# Patient Record
Sex: Female | Born: 1975 | Race: White | Hispanic: No | Smoking: Current every day smoker
Health system: Southern US, Community
[De-identification: ages and names within clinical notes are randomized; demographics above are authoritative.]

## PROBLEM LIST (undated history)

## (undated) DIAGNOSIS — F909 Attention-deficit hyperactivity disorder, unspecified type: Secondary | ICD-10-CM

## (undated) HISTORY — PX: BREAST SURGERY: SHX581

## (undated) HISTORY — DX: Attention-deficit hyperactivity disorder, unspecified type: F90.9

---

## 2009-09-22 ENCOUNTER — Encounter: Admission: RE | Admit: 2009-09-22 | Discharge: 2009-09-22 | Payer: Self-pay | Admitting: Specialist

## 2011-08-24 ENCOUNTER — Emergency Department (HOSPITAL_COMMUNITY)
Admission: EM | Admit: 2011-08-24 | Discharge: 2011-08-24 | Disposition: A | Discharge status: Home / Self Care | Payer: BC Managed Care – PPO | Attending: Emergency Medicine | Admitting: Emergency Medicine

## 2011-08-24 ENCOUNTER — Encounter (HOSPITAL_COMMUNITY): Payer: Self-pay

## 2011-08-24 ENCOUNTER — Emergency Department (HOSPITAL_COMMUNITY): Payer: BC Managed Care – PPO

## 2011-08-24 DIAGNOSIS — R6883 Chills (without fever): Secondary | ICD-10-CM | POA: Insufficient documentation

## 2011-08-24 DIAGNOSIS — N83201 Unspecified ovarian cyst, right side: Secondary | ICD-10-CM

## 2011-08-24 DIAGNOSIS — R61 Generalized hyperhidrosis: Secondary | ICD-10-CM | POA: Insufficient documentation

## 2011-08-24 DIAGNOSIS — N83209 Unspecified ovarian cyst, unspecified side: Secondary | ICD-10-CM | POA: Insufficient documentation

## 2011-08-24 DIAGNOSIS — R110 Nausea: Secondary | ICD-10-CM | POA: Insufficient documentation

## 2011-08-24 DIAGNOSIS — R109 Unspecified abdominal pain: Secondary | ICD-10-CM | POA: Insufficient documentation

## 2011-08-24 LAB — COMPREHENSIVE METABOLIC PANEL
ALT: 23 U/L (ref 0–35)
Calcium: 8.7 mg/dL (ref 8.4–10.5)
Creatinine, Ser: 0.56 mg/dL (ref 0.50–1.10)
GFR calc Af Amer: >90 mL/min (ref >90)
Glucose, Bld: 74 mg/dL (ref 70–99)
Sodium: 141 mEq/L (ref 135–145)
Total Protein: 6.9 g/dL (ref 6.0–8.3)

## 2011-08-24 LAB — DIFFERENTIAL
Basophils Absolute: 0.1 10*3/uL (ref 0.0–0.1)
Eosinophils Absolute: 0.1 10*3/uL (ref 0.0–0.7)
Eosinophils Relative: 1 % (ref 0–5)
Lymphs Abs: 1.8 10*3/uL (ref 0.7–4.0)

## 2011-08-24 LAB — CBC
MCH: 33 pg (ref 26.0–34.0)
MCV: 92.5 fL (ref 78.0–100.0)
Platelets: 220 10*3/uL (ref 150–400)
RDW: 12.7 % (ref 11.5–15.5)

## 2011-08-24 LAB — URINALYSIS, ROUTINE W REFLEX MICROSCOPIC
Hgb urine dipstick: NEGATIVE
Leukocytes, UA: NEGATIVE
Nitrite: NEGATIVE
Specific Gravity, Urine: 1.022 (ref 1.005–1.030)
Urobilinogen, UA: 0.2 mg/dL (ref 0.0–1.0)

## 2011-08-24 MED ORDER — HYDROCODONE-ACETAMINOPHEN 5-500 MG PO TABS: 1.0000 | ORAL_TABLET | Freq: Four times a day (QID) | ORAL | Status: AC | PRN

## 2011-08-24 MED ORDER — ONDANSETRON HCL 4 MG/2ML IJ SOLN
4.0000 mg | Freq: Once | INTRAMUSCULAR | Status: AC
  Administered 2011-08-24: 4 mg via INTRAVENOUS
  Filled 2011-08-24: qty 2

## 2011-08-24 MED ORDER — IOHEXOL 300 MG/ML  SOLN
20.0000 mL | INTRAMUSCULAR | Status: AC
  Administered 2011-08-24 (×2): 20 mL via ORAL

## 2011-08-24 MED ORDER — HYDROMORPHONE HCL PF 1 MG/ML IJ SOLN
1.0000 mg | Freq: Once | INTRAMUSCULAR | Status: AC
  Administered 2011-08-24: 1 mg via INTRAVENOUS
  Filled 2011-08-24: qty 1

## 2011-08-24 MED ORDER — HYDROMORPHONE HCL PF 1 MG/ML IJ SOLN
0.5000 mg | Freq: Once | INTRAMUSCULAR | Status: AC
  Administered 2011-08-24: 1 mg via INTRAVENOUS
  Filled 2011-08-24: qty 1

## 2011-08-24 MED ORDER — IOHEXOL 300 MG/ML  SOLN
100.0000 mL | Freq: Once | INTRAMUSCULAR | Status: AC | PRN
  Administered 2011-08-24: 100 mL via INTRAVENOUS

## 2011-08-24 MED ORDER — ONDANSETRON HCL 4 MG PO TABS: 4.0000 mg | ORAL_TABLET | Freq: Four times a day (QID) | ORAL | Status: AC

## 2011-08-24 MED ORDER — SODIUM CHLORIDE 0.9 % IV BOLUS (SEPSIS)
1000.0000 mL | Freq: Once | INTRAVENOUS | Status: AC
  Administered 2011-08-24: 1000 mL via INTRAVENOUS

## 2011-08-24 NOTE — ED Provider Notes (Signed)
Medical screening examination/treatment/procedure(s) were performed by non-physician practitioner and as supervising physician I was immediately available for consultation/collaboration.  Glenette Bookwalter, MD 08/24/11 1351 

## 2011-08-24 NOTE — Discharge Instructions (Signed)
Ovarian Cyst The ovaries are small organs that are on each side of the uterus. The ovaries are the organs that produce the female hormones, estrogen and progesterone. An ovarian cyst is a sac filled with fluid that can vary in its size. It is normal for a small cyst to form in women who are in the childbearing age and who have menstrual periods. This type of cyst is called a follicle cyst that becomes an ovulation cyst (corpus luteum cyst) after it produces the women's egg. It later goes away on its own if the woman does not become pregnant. There are other kinds of ovarian cysts that may cause problems and may need to be treated. The most serious problem is a cyst with cancer. It should be noted that menopausal women who have an ovarian cyst are at a higher risk of it being a cancer cyst. They should be evaluated very quickly, thoroughly and followed closely. This is especially true in menopausal women because of the high rate of ovarian cancer in women in menopause. CAUSES AND TYPES OF OVARIAN CYSTS:  FUNCTIONAL CYST: The follicle/corpus luteum cyst is a functional cyst that occurs every month during ovulation with the menstrual cycle. They go away with the next menstrual cycle if the woman does not get pregnant. Usually, there are no symptoms with a functional cyst.   ENDOMETRIOMA CYST: This cyst develops from the lining of the uterus tissue. This cyst gets in or on the ovary. It grows every month from the bleeding during the menstrual period. It is also called a "chocolate cyst" because it becomes filled with blood that turns brown. This cyst can cause pain in the lower abdomen during intercourse and with your menstrual period.   CYSTADENOMA CYST: This cyst develops from the cells on the outside of the ovary. They usually are not cancerous. They can get very big and cause lower abdomen pain and pain with intercourse. This type of cyst can twist on itself, cut off its blood supply and cause severe pain.  It also can easily rupture and cause a lot of pain.   DERMOID CYST: This type of cyst is sometimes found in both ovaries. They are found to have different kinds of body tissue in the cyst. The tissue includes skin, teeth, hair, and/or cartilage. They usually do not have symptoms unless they get very big. Dermoid cysts are rarely cancerous.   POLYCYSTIC OVARY: This is a rare condition with hormone problems that produces many small cysts on both ovaries. The cysts are follicle-like cysts that never produce an egg and become a corpus luteum. It can cause an increase in body weight, infertility, acne, increase in body and facial hair and lack of menstrual periods or rare menstrual periods. Many women with this problem develop type 2 diabetes. The exact cause of this problem is unknown. A polycystic ovary is rarely cancerous.   THECA LUTEIN CYST: Occurs when too much hormone (human chorionic gonadotropin) is produced and over-stimulates the ovaries to produce an egg. They are frequently seen when doctors stimulate the ovaries for invitro-fertilization (test tube babies).   LUTEOMA CYST: This cyst is seen during pregnancy. Rarely it can cause an obstruction to the birth canal during labor and delivery. They usually go away after delivery.  SYMPTOMS   Pelvic pain or pressure.   Pain during sexual intercourse.   Increasing girth (swelling) of the abdomen.   Abnormal menstrual periods.   Increasing pain with menstrual periods.   You stop having   menstrual periods and you are not pregnant.  DIAGNOSIS  The diagnosis can be made during:  Routine or annual pelvic examination (common).   Ultrasound.   X-ray of the pelvis.   CT Scan.   MRI.   Blood tests.  TREATMENT   Treatment may only be to follow the cyst monthly for 2 to 3 months with your caregiver. Many go away on their own, especially functional cysts.   May be aspirated (drained) with a long needle with ultrasound, or by laparoscopy  (inserting a tube into the pelvis through a small incision).   The whole cyst can be removed by laparoscopy.   Sometimes the cyst may need to be removed through an incision in the lower abdomen.   Hormone treatment is sometimes used to help dissolve certain cysts.   Birth control pills are sometimes used to help dissolve certain cysts.  HOME CARE INSTRUCTIONS  Follow your caregiver's advice regarding:  Medicine.   Follow up visits to evaluate and treat the cyst.   You may need to come back or make an appointment with another caregiver, to find the exact cause of your cyst, if your caregiver is not a gynecologist.   Get your yearly and recommended pelvic examinations and Pap tests.   Let your caregiver know if you have had an ovarian cyst in the past.  SEEK MEDICAL CARE IF:   Your periods are late, irregular, they stop, or are painful.   Your stomach (abdomen) or pelvic pain does not go away.   Your stomach becomes larger or swollen.   You have pressure on your bladder or trouble emptying your bladder completely.   You have painful sexual intercourse.   You have feelings of fullness, pressure, or discomfort in your stomach.   You lose weight for no apparent reason.   You feel generally ill.   You become constipated.   You lose your appetite.   You develop acne.   You have an increase in body and facial hair.   You are gaining weight, without changing your exercise and eating habits.   You think you are pregnant.  SEEK IMMEDIATE MEDICAL CARE IF:   You have increasing abdominal pain.   You feel sick to your stomach (nausea) and/or vomit.   You develop a fever that comes on suddenly.   You develop abdominal pain during a bowel movement.   Your menstrual periods become heavier than usual.  Document Released: 05/27/2005 Document Revised: 05/16/2011 Document Reviewed: 03/30/2009 [employer] Patient Information 2012 [employer]. 

## 2011-08-24 NOTE — ED Provider Notes (Signed)
History     CSN: [id number]  Arrival date & time 08/24/11  0919   First MD Initiated Contact with Patient 08/24/11 [phone]      Chief Complaint  Patient presents with  . Abdominal Pain    (Consider location/radiation/quality/duration/timing/severity/associated sxs/prior treatment) HPI Comments: Patient here with acute sudden onset of right sided abdominal pain, states she had just gotten up, felt like she should have a bowel movement and then the pain came on - she states that initially she thought this was gas pains, but then she got sweaty and nauseous but did not vomit.  She reports no pregnancy, vaginal discharge, dysuria, hematuria, states that the pain is easing off now but continues with it to the right of her umbilicus.  Denies any abdominal surgeries.  Patient is a 36 y.o. female presenting with abdominal pain. The history is provided by the patient. No language interpreter was used.  Abdominal Pain The primary symptoms of the illness include abdominal pain and nausea. The primary symptoms of the illness do not include fever, fatigue, shortness of breath, vomiting, diarrhea, hematemesis, hematochezia, dysuria, vaginal discharge or vaginal bleeding. The current episode started 1 to 2 hours ago. The onset of the illness was sudden. The problem has been gradually improving.  The patient states that she believes she is currently not pregnant. The patient has not had a change in bowel habit. Additional symptoms associated with the illness include chills and diaphoresis. Symptoms associated with the illness do not include anorexia, heartburn, constipation, urgency, hematuria, frequency or back pain.    History reviewed. No pertinent past medical history.  History reviewed. No pertinent past surgical history.  History reviewed. No pertinent family history.  History  Substance Use Topics  . Smoking status: Current Everyday Smoker  . Smokeless tobacco: Not on file  . Alcohol Use: Yes     OB History    Grav Para Term Preterm Abortions TAB SAB Ect Mult Living                  Review of Systems  Constitutional: Positive for chills and diaphoresis. Negative for fever and fatigue.  Respiratory: Negative for shortness of breath.   Cardiovascular: Negative for chest pain.  Gastrointestinal: Positive for nausea and abdominal pain. Negative for heartburn, vomiting, diarrhea, constipation, hematochezia, anorexia and hematemesis.  Genitourinary: Negative for dysuria, urgency, frequency, hematuria, vaginal bleeding and vaginal discharge.  Musculoskeletal: Negative for back pain.  All other systems reviewed and are negative.    Allergies  Review of patient's allergies indicates not on file.  Home Medications   Current Outpatient Rx  Name Route Sig Dispense Refill  . AMPHETAMINE-DEXTROAMPHET ER 30 MG PO CP24 Oral Take 30 mg by mouth every morning.    Marland Kitchen PSEUDOEPHEDRINE-ACETAMINOPHEN 30-500 MG PO TABS Oral Take 1 tablet by mouth every 4 (four) hours as needed. For sinus congestion      BP 120/70  Pulse 104  Temp(Src) 97.5 F (36.4 C) (Oral)  Resp 18  SpO2 100%  LMP 08/07/2011  Physical Exam  Nursing note and vitals reviewed. Constitutional: She is oriented to person, place, and time. She appears well-developed and well-nourished. No distress.  HENT:  Head: Normocephalic and atraumatic.  Right Ear: External ear normal.  Left Ear: External ear normal.  Nose: Nose normal.  Mouth/Throat: Oropharynx is clear and moist. No oropharyngeal exudate.  Eyes: Conjunctivae are normal. Pupils are equal, round, and reactive to light. No scleral icterus.  Neck: Normal range of motion. Neck  supple.  Cardiovascular: Normal rate, regular rhythm and normal heart sounds.  Exam reveals no gallop and no friction rub.   No murmur heard. Pulmonary/Chest: Effort normal and breath sounds normal. No respiratory distress. She exhibits no tenderness.  Abdominal: Soft. Bowel sounds are  normal. She exhibits no distension and no mass. There is tenderness. There is guarding. There is no rebound.    Genitourinary: Vagina normal and uterus normal. There is no rash, tenderness or lesion on the right labia. There is no rash, tenderness or lesion on the left labia. Uterus is not enlarged and not tender. Cervix exhibits no motion tenderness, no discharge and no friability. Right adnexum displays no mass and no tenderness. Left adnexum displays no mass and no tenderness. No tenderness or bleeding around the vagina. No vaginal discharge found.  Musculoskeletal: Normal range of motion. She exhibits no edema and no tenderness.  Lymphadenopathy:    She has no cervical adenopathy.  Neurological: She is alert and oriented to person, place, and time. No cranial nerve deficit.  Skin: Skin is warm and dry. No rash noted. No erythema. No pallor.  Psychiatric: She has a normal mood and affect. Her behavior is normal. Judgment and thought content normal.    ED Course  Procedures (including critical care time)   Labs Reviewed  CBC  DIFFERENTIAL  COMPREHENSIVE METABOLIC PANEL  LIPASE, BLOOD  URINALYSIS, ROUTINE W REFLEX MICROSCOPIC  GC/CHLAMYDIA PROBE AMP, GENITAL  WET PREP, GENITAL   No results found. Results for orders placed during the hospital encounter of 08/24/11  CBC      Component Value Range   WBC 11.8 (*) 4.0 - 10.5 (K/uL)   RBC 4.52  3.87 - 5.11 (MIL/uL)   Hemoglobin 14.9  12.0 - 15.0 (g/dL)   HCT 16.1  09.6 - 04.5 (%)   MCV 92.5  78.0 - 100.0 (fL)   MCH 33.0  26.0 - 34.0 (pg)   MCHC 35.6  30.0 - 36.0 (g/dL)   RDW 40.9  81.1 - 91.4 (%)   Platelets 220  150 - 400 (K/uL)  DIFFERENTIAL      Component Value Range   Neutrophils Relative 77  43 - 77 (%)   Neutro Abs 9.1 (*) 1.7 - 7.7 (K/uL)   Lymphocytes Relative 15  12 - 46 (%)   Lymphs Abs 1.8  0.7 - 4.0 (K/uL)   Monocytes Relative 6  3 - 12 (%)   Monocytes Absolute 0.7  0.1 - 1.0 (K/uL)   Eosinophils Relative 1  0 - 5  (%)   Eosinophils Absolute 0.1  0.0 - 0.7 (K/uL)   Basophils Relative 1  0 - 1 (%)   Basophils Absolute 0.1  0.0 - 0.1 (K/uL)  COMPREHENSIVE METABOLIC PANEL      Component Value Range   Sodium 141  135 - 145 (mEq/L)   Potassium 4.0  3.5 - 5.1 (mEq/L)   Chloride 106  96 - 112 (mEq/L)   CO2 26  19 - 32 (mEq/L)   Glucose, Bld 74  70 - 99 (mg/dL)   BUN 7  6 - 23 (mg/dL)   Creatinine, Ser 7.82  0.50 - 1.10 (mg/dL)   Calcium 8.7  8.4 - 95.6 (mg/dL)   Total Protein 6.9  6.0 - 8.3 (g/dL)   Albumin 3.6  3.5 - 5.2 (g/dL)   AST 23  0 - 37 (U/L)   ALT 23  0 - 35 (U/L)   Alkaline Phosphatase 62  39 - 117 (  U/L)   Total Bilirubin 0.3  0.3 - 1.2 (mg/dL)   GFR calc non Af Amer >90  >90 (mL/min)   GFR calc Af Amer >90  >90 (mL/min)  LIPASE, BLOOD      Component Value Range   Lipase 25  11 - 59 (U/L)  URINALYSIS, ROUTINE W REFLEX MICROSCOPIC      Component Value Range   Color, Urine YELLOW  YELLOW    APPearance CLOUDY (*) CLEAR    Specific Gravity, Urine 1.022  1.005 - 1.030    pH 6.0  5.0 - 8.0    Glucose, UA NEGATIVE  NEGATIVE (mg/dL)   Hgb urine dipstick NEGATIVE  NEGATIVE    Bilirubin Urine NEGATIVE  NEGATIVE    Ketones, ur NEGATIVE  NEGATIVE (mg/dL)   Protein, ur NEGATIVE  NEGATIVE (mg/dL)   Urobilinogen, UA 0.2  0.0 - 1.0 (mg/dL)   Nitrite NEGATIVE  NEGATIVE    Leukocytes, UA NEGATIVE  NEGATIVE   POCT PREGNANCY, URINE      Component Value Range   Preg Test, Ur NEGATIVE  NEGATIVE    No results found.    No diagnosis found.    MDM  Patient here with right umbilical abdominal pain, I doubt cholecystitis or cholelithiasis but I am suspicious for an early appendicitis.  Plan to send the patient to the CDU for holding while awaiting CT scan.        Izola Price [place], [place] 08/24/11 1115

## 2011-08-24 NOTE — ED Provider Notes (Signed)
Assumed patient care at 9164. 36 year old female with acute onset of periumbilical abdominal pain. Patient was evaluated by Wylene Simmer, PA-C.  Pt moved to the CDU while waiting for abdominal CT scan to rule out appendicitis.  Pt sts pain is acute onset, felt like gas with sharp pain to periumbilical region, has nausea without vomiting or diarrhea.  Also experienced diaphoresis.  On exam, heart with RRR, lung CTAB, abd soft, tender to periumbilical region, no guarding, no rebound tenderness.  No peritoneal sign.  Will continue to monitor.    2:08 PM CT of abd/pelvis shows a 3.1x2.8cm R ovarian cyst.  No evidence of appendicitis, hernia, or other acute abnormality.  Pt's pain is likely related to her ovarian cyst.  Pain is currently controlled.  Pt is in NAD, with stable normal vital sign.  Pt does have a OBGYN to f/u.  She is able to tolerates PO.  She is afebrile.  Pt felt comfortable to be discharge.  Will d/c with pain medication and f/u instruction.    Fayrene Helper, PA-C 08/24/11 1411

## 2011-08-24 NOTE — ED Provider Notes (Signed)
Medical screening examination/treatment/procedure(s) were performed by non-physician practitioner and as supervising physician I was immediately available for consultation/collaboration.  Stanislav Gervase, MD 08/24/11 1602 

## 2011-08-24 NOTE — ED Notes (Signed)
Pt here for abd apin, onset this am, sts no n/v/d noted

## 2011-08-24 NOTE — ED Notes (Signed)
Pt is in ct scan and hasn't received dilaudid yet. Will give dilaudid when pt returns to cdu.

## 2011-08-26 LAB — GC/CHLAMYDIA PROBE AMP, GENITAL
Chlamydia, DNA Probe: NEGATIVE
GC Probe Amp, Genital: NEGATIVE

## 2013-03-29 ENCOUNTER — Ambulatory Visit: Payer: BC Managed Care – PPO

## 2013-03-29 ENCOUNTER — Ambulatory Visit (INDEPENDENT_AMBULATORY_CARE_PROVIDER_SITE_OTHER): Payer: BC Managed Care – PPO | Admitting: Internal Medicine

## 2013-03-29 ENCOUNTER — Ambulatory Visit: Payer: Self-pay

## 2013-03-29 VITALS — BP 120/74 | HR 98 | Temp 98.4°F | Resp 18 | Ht 64.0 in | Wt 136.0 lb

## 2013-03-29 DIAGNOSIS — M25562 Pain in left knee: Secondary | ICD-10-CM

## 2013-03-29 DIAGNOSIS — M6580 Other synovitis and tenosynovitis, unspecified site: Secondary | ICD-10-CM

## 2013-03-29 DIAGNOSIS — M76892 Other specified enthesopathies of left lower limb, excluding foot: Secondary | ICD-10-CM

## 2013-03-29 DIAGNOSIS — M25569 Pain in unspecified knee: Secondary | ICD-10-CM

## 2013-03-29 MED ORDER — MELOXICAM 15 MG PO TABS: 15.0000 mg | ORAL_TABLET | Freq: Every day | ORAL | Status: DC

## 2013-03-29 NOTE — Progress Notes (Signed)
  Subjective:    Patient ID: Christina Hanson, female    DOB: 1976/03/21, 37 y.o.   MRN: [id number]  HPI Patient has had right knee pain for 1+ month. Is on feet all day as an optician. No swelling. Pain hurts on medial aspect, feels like bones are rubbing together. Hurts really bad with getting out of bed, better after being up for awhile. Wakes her up in middle of the night. Pain bearable during day while working. No falls, no weakness, no injury. No regular exercise for 5 months. Was previously going to gym 5x week. 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  Has tried some ibuprofen which helped a little.  Sees Dr. Mayford Knife for primary care. Currently taking Cipro 500 mg qid for axillary abscess. Has gotten smaller. Has follow up scheduled for 1 week.  PMH- ADHD- for many years  SH- Breast augmentation 2005  SH- Divorced, lives with boy friend. Smokes .5 ppd, less if busy at work, smoked 2 cigarettes yesterday ETOH- occasional glass wine Drugs- none  Dad- alive 62, knee replacements, colon CA age 83 Mom- alive 59, healthy Brother- healthy   Review of Systems  Constitutional: Negative for fever and activity change.  Musculoskeletal: Negative for back pain, gait problem and joint swelling.  Neurological: Negative for weakness.       Objective:   Physical Exam  Nursing note and vitals reviewed. Constitutional: She is oriented to person, place, and time. She appears well-developed and well-nourished.  Eyes: Conjunctivae are normal. Right eye exhibits no discharge. Left eye exhibits no discharge.  Musculoskeletal: Normal range of motion. She exhibits tenderness.  Tender over left, medial quadricept insertion. Pain with rotation of ankle while extending knee.  Neurological: She is alert and oriented to person, place, and time. She has normal reflexes.  Skin: Skin is warm and dry.  Psychiatric: She has a normal mood and affect. Her behavior is normal. Judgment and  thought content normal.     [facility] reading (PRIMARY) by  Dr. Maizie Garno=Normal Knee   Assessment & Plan:  Knee pain, left -due to tendonitis-vastus medialis  Meds ordered this encounter  Medications         . meloxicam (MOBIC) 15 MG tablet    Sig: Take 1 tablet (15 mg total) by mouth daily.    Dispense:  30 tablet    Refill:  1  1- meloxicam as above for 1 month 2- quadricept strengthening exercises followed by 15-20 minutes ice 3- Heat am/pm 4- If no improvement in 1 month- physical therapy referral  DG/RPD

## 2013-03-29 NOTE — Patient Instructions (Signed)
Take molaxicam for 1 month.  Wear knee brace with hole over kneecap. Exercise quadriceps- ice following for 15-20 minutes. If no improvement in 1 month. Call for physical therapy referral.

## 2013-12-11 ENCOUNTER — Emergency Department (HOSPITAL_COMMUNITY): Payer: BC Managed Care – PPO

## 2013-12-11 ENCOUNTER — Emergency Department (HOSPITAL_COMMUNITY)
Admission: EM | Admit: 2013-12-11 | Discharge: 2013-12-11 | Disposition: A | Discharge status: Home / Self Care | Payer: BC Managed Care – PPO | Attending: Emergency Medicine | Admitting: Emergency Medicine

## 2013-12-11 ENCOUNTER — Encounter (HOSPITAL_COMMUNITY): Payer: Self-pay | Admitting: Emergency Medicine

## 2013-12-11 DIAGNOSIS — S0990XA Unspecified injury of head, initial encounter: Secondary | ICD-10-CM | POA: Insufficient documentation

## 2013-12-11 DIAGNOSIS — S199XXA Unspecified injury of neck, initial encounter: Principal | ICD-10-CM

## 2013-12-11 DIAGNOSIS — F17200 Nicotine dependence, unspecified, uncomplicated: Secondary | ICD-10-CM | POA: Insufficient documentation

## 2013-12-11 DIAGNOSIS — Z79899 Other long term (current) drug therapy: Secondary | ICD-10-CM | POA: Insufficient documentation

## 2013-12-11 DIAGNOSIS — Y9289 Other specified places as the place of occurrence of the external cause: Secondary | ICD-10-CM | POA: Insufficient documentation

## 2013-12-11 DIAGNOSIS — M542 Cervicalgia: Secondary | ICD-10-CM

## 2013-12-11 DIAGNOSIS — Y9389 Activity, other specified: Secondary | ICD-10-CM | POA: Insufficient documentation

## 2013-12-11 DIAGNOSIS — S0993XA Unspecified injury of face, initial encounter: Secondary | ICD-10-CM | POA: Insufficient documentation

## 2013-12-11 MED ORDER — DIPHENHYDRAMINE HCL 25 MG PO CAPS
25.0000 mg | ORAL_CAPSULE | Freq: Once | ORAL | Status: AC
  Administered 2013-12-11: 25 mg via ORAL
  Filled 2013-12-11: qty 1

## 2013-12-11 MED ORDER — IBUPROFEN 800 MG PO TABS: 800.0000 mg | ORAL_TABLET | Freq: Three times a day (TID) | ORAL | Status: AC

## 2013-12-11 MED ORDER — METHOCARBAMOL 500 MG PO TABS: 500.0000 mg | ORAL_TABLET | Freq: Two times a day (BID) | ORAL | Status: DC

## 2013-12-11 MED ORDER — METOCLOPRAMIDE HCL 10 MG PO TABS
10.0000 mg | ORAL_TABLET | Freq: Once | ORAL | Status: AC
  Administered 2013-12-11: 10 mg via ORAL
  Filled 2013-12-11: qty 1
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
## 2013-12-11 NOTE — ED Provider Notes (Signed)
Medical screening examination/treatment/procedure(s) were performed by non-physician practitioner and as supervising physician I was immediately available for consultation/collaboration.   EKG Interpretation None        Christopher J. Pollina, MD 12/11/13 1524 

## 2013-12-31 ENCOUNTER — Encounter: Payer: Self-pay | Admitting: Neurology

## 2013-12-31 ENCOUNTER — Ambulatory Visit (INDEPENDENT_AMBULATORY_CARE_PROVIDER_SITE_OTHER): Payer: BC Managed Care – PPO | Admitting: Neurology

## 2013-12-31 VITALS — BP 134/90 | HR 84 | Resp 18 | Ht 65.0 in | Wt 147.0 lb

## 2013-12-31 DIAGNOSIS — R519 Headache, unspecified: Secondary | ICD-10-CM

## 2013-12-31 DIAGNOSIS — G4486 Cervicogenic headache: Secondary | ICD-10-CM | POA: Insufficient documentation

## 2013-12-31 DIAGNOSIS — R51 Headache: Secondary | ICD-10-CM

## 2013-12-31 DIAGNOSIS — M542 Cervicalgia: Secondary | ICD-10-CM

## 2013-12-31 MED ORDER — TIZANIDINE HCL 2 MG PO CAPS: ORAL_CAPSULE | ORAL | Status: AC
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
## 2014-01-09 ENCOUNTER — Ambulatory Visit
Admission: RE | Admit: 2014-01-09 | Discharge: 2014-01-09 | Disposition: A | Discharge status: Home / Self Care | Payer: BC Managed Care – PPO | Source: Ambulatory Visit | Attending: Neurology | Admitting: Neurology

## 2014-01-10 ENCOUNTER — Telehealth: Payer: Self-pay | Admitting: Neurology

## 2014-01-10 NOTE — Telephone Encounter (Signed)
[facility] will be contacting the patient today 01/10/14

## 2014-01-10 NOTE — Telephone Encounter (Signed)
Patient made aware that Dr. Everlena CooperJaffe is out of the office currently.  I called her with results of MRI brain which notes bilateral mastoid effusions, otherwise normal study.  Patient denies any pain around the ear, fever, or drainage, so likely this is incidental finding.  She can follow-up with PCP, if she does develop these symptoms.    She also reports that physical therapy has not contacted her yet, so I will ask our staff to look into this.    Donika K. Allena KatzPatel, DO

## 2014-01-19 ENCOUNTER — Ambulatory Visit: Payer: BC Managed Care – PPO | Attending: Neurology | Admitting: Physical Therapy

## 2014-01-19 DIAGNOSIS — M542 Cervicalgia: Secondary | ICD-10-CM | POA: Insufficient documentation

## 2014-01-25 ENCOUNTER — Ambulatory Visit: Payer: BC Managed Care – PPO | Admitting: Physical Therapy

## 2014-01-27 ENCOUNTER — Ambulatory Visit: Payer: BC Managed Care – PPO | Admitting: Physical Therapy

## 2014-01-27 DIAGNOSIS — M542 Cervicalgia: Secondary | ICD-10-CM | POA: Diagnosis not present

## 2014-02-01 ENCOUNTER — Encounter: Payer: BC Managed Care – PPO | Admitting: Physical Therapy

## 2014-02-02 ENCOUNTER — Ambulatory Visit: Payer: BC Managed Care – PPO | Admitting: Physical Therapy

## 2014-02-02 DIAGNOSIS — M542 Cervicalgia: Secondary | ICD-10-CM | POA: Diagnosis not present

## 2014-02-04 ENCOUNTER — Ambulatory Visit: Payer: BC Managed Care – PPO | Admitting: Physical Therapy

## 2014-02-04 DIAGNOSIS — M542 Cervicalgia: Secondary | ICD-10-CM | POA: Diagnosis not present

## 2014-02-08 ENCOUNTER — Ambulatory Visit: Payer: No Typology Code available for payment source | Attending: Neurology | Admitting: Physical Therapy

## 2014-02-08 DIAGNOSIS — M542 Cervicalgia: Secondary | ICD-10-CM | POA: Insufficient documentation

## 2014-02-10 ENCOUNTER — Ambulatory Visit: Payer: No Typology Code available for payment source | Admitting: Physical Therapy

## 2014-02-10 DIAGNOSIS — M542 Cervicalgia: Secondary | ICD-10-CM | POA: Diagnosis not present

## 2014-03-03 ENCOUNTER — Encounter: Payer: Self-pay | Admitting: Neurology

## 2014-03-03 ENCOUNTER — Ambulatory Visit (INDEPENDENT_AMBULATORY_CARE_PROVIDER_SITE_OTHER): Payer: BC Managed Care – PPO | Admitting: Neurology

## 2014-03-03 VITALS — BP 104/68 | HR 74 | Resp 16 | Wt 150.4 lb

## 2014-03-03 DIAGNOSIS — G4486 Cervicogenic headache: Secondary | ICD-10-CM

## 2014-03-03 DIAGNOSIS — R51 Headache: Secondary | ICD-10-CM

## 2014-03-03 NOTE — Patient Instructions (Signed)
Follow up as needed
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
## 2014-05-12 ENCOUNTER — Telehealth: Payer: Self-pay | Admitting: Neurology

## 2014-05-12 NOTE — Telephone Encounter (Signed)
We got a request from [employer] for bills and records. Sent it to medical records at [facility] on 05-12-14

## 2017-09-09 ENCOUNTER — Encounter (HOSPITAL_BASED_OUTPATIENT_CLINIC_OR_DEPARTMENT_OTHER): Payer: Self-pay | Admitting: Emergency Medicine

## 2017-09-09 ENCOUNTER — Emergency Department (HOSPITAL_BASED_OUTPATIENT_CLINIC_OR_DEPARTMENT_OTHER)
Admission: EM | Admit: 2017-09-09 | Discharge: 2017-09-09 | Disposition: A | Discharge status: Home / Self Care | Payer: BLUE CROSS/BLUE SHIELD | Attending: Emergency Medicine | Admitting: Emergency Medicine

## 2017-09-09 ENCOUNTER — Emergency Department (HOSPITAL_BASED_OUTPATIENT_CLINIC_OR_DEPARTMENT_OTHER): Payer: BLUE CROSS/BLUE SHIELD

## 2017-09-09 ENCOUNTER — Other Ambulatory Visit: Payer: Self-pay

## 2017-09-09 DIAGNOSIS — N200 Calculus of kidney: Secondary | ICD-10-CM | POA: Insufficient documentation

## 2017-09-09 DIAGNOSIS — R1084 Generalized abdominal pain: Secondary | ICD-10-CM | POA: Diagnosis present

## 2017-09-09 DIAGNOSIS — F17210 Nicotine dependence, cigarettes, uncomplicated: Secondary | ICD-10-CM | POA: Insufficient documentation

## 2017-09-09 DIAGNOSIS — Z79899 Other long term (current) drug therapy: Secondary | ICD-10-CM | POA: Insufficient documentation

## 2017-09-09 LAB — BASIC METABOLIC PANEL
Anion gap: 7 (ref 5–15)
BUN: 9 mg/dL (ref 6–20)
CHLORIDE: 105 mmol/L (ref 101–111)
CO2: 22 mmol/L (ref 22–32)
CREATININE: 0.64 mg/dL (ref 0.44–1.00)
Calcium: 8.2 mg/dL — ABNORMAL LOW (ref 8.9–10.3)
GFR calc Af Amer: >60 mL/min (ref >60)
GLUCOSE: 94 mg/dL (ref 65–99)
Potassium: 4 mmol/L (ref 3.5–5.1)
SODIUM: 134 mmol/L — AB (ref 135–145)

## 2017-09-09 LAB — CBC WITH DIFFERENTIAL/PLATELET
Basophils Absolute: 0.1 10*3/uL (ref 0.0–0.1)
Basophils Relative: 1 %
EOS ABS: 0.1 10*3/uL (ref 0.0–0.7)
Eosinophils Relative: 1 %
HCT: 40.1 % (ref 36.0–46.0)
Hemoglobin: 14 g/dL (ref 12.0–15.0)
LYMPHS ABS: 2.1 10*3/uL (ref 0.7–4.0)
Lymphocytes Relative: 19 %
MCH: 30.8 pg (ref 26.0–34.0)
MCHC: 34.9 g/dL (ref 30.0–36.0)
MCV: 88.3 fL (ref 78.0–100.0)
MONO ABS: 0.8 10*3/uL (ref 0.1–1.0)
Monocytes Relative: 7 %
Neutro Abs: 7.9 10*3/uL — ABNORMAL HIGH (ref 1.7–7.7)
Neutrophils Relative %: 72 %
Platelets: 282 10*3/uL (ref 150–400)
RBC: 4.54 MIL/uL (ref 3.87–5.11)
RDW: 13.3 % (ref 11.5–15.5)
WBC: 10.9 10*3/uL — AB (ref 4.0–10.5)

## 2017-09-09 LAB — URINALYSIS, MICROSCOPIC (REFLEX)

## 2017-09-09 LAB — URINALYSIS, ROUTINE W REFLEX MICROSCOPIC
Glucose, UA: NEGATIVE mg/dL
KETONES UR: 15 mg/dL — AB
NITRITE: POSITIVE — AB
Protein, ur: 100 mg/dL — AB
Specific Gravity, Urine: >1.03 — ABNORMAL HIGH (ref 1.005–1.030)
pH: 5 (ref 5.0–8.0)

## 2017-09-09 LAB — PREGNANCY, URINE: PREG TEST UR: NEGATIVE

## 2017-09-09 MED ORDER — MORPHINE SULFATE (PF) 4 MG/ML IV SOLN
4.0000 mg | Freq: Once | INTRAVENOUS | Status: AC
  Administered 2017-09-09: 4 mg via INTRAVENOUS
  Filled 2017-09-09: qty 1

## 2017-09-09 MED ORDER — KETOROLAC TROMETHAMINE 30 MG/ML IJ SOLN
30.0000 mg | Freq: Once | INTRAMUSCULAR | Status: AC
  Administered 2017-09-09: 30 mg via INTRAVENOUS
  Filled 2017-09-09: qty 1

## 2017-09-09 MED ORDER — ONDANSETRON HCL 4 MG/2ML IJ SOLN
4.0000 mg | Freq: Once | INTRAMUSCULAR | Status: AC
  Administered 2017-09-09: 4 mg via INTRAVENOUS
  Filled 2017-09-09: qty 2

## 2017-09-09 MED ORDER — OXYCODONE-ACETAMINOPHEN 5-325 MG PO TABS: 1.0000 | ORAL_TABLET | Freq: Four times a day (QID) | ORAL | 0 refills | Status: AC | PRN

## 2017-09-09 NOTE — Discharge Instructions (Addendum)
Percocet as prescribed as needed for pain.  Follow-up with urology if not improving in the next 2-3 days.  The contact information for [facility] has been provided in this discharge summary for you to call and make these arrangements.

## 2017-09-09 NOTE — ED Provider Notes (Signed)
[facility] EMERGENCY DEPARTMENT Provider Note   CSN: [id number] Arrival date & time: 09/09/17  0551     History   Chief Complaint Chief Complaint  Patient presents with  . Flank Pain    HPI Christina Hanson is a 42 y.o. female.  Patient is a 42 year old female with no significant past medical history.  She presents today for evaluation of right flank pain.  This started abruptly this morning and woke her from sleep.  She felt an urgency to urinate, then went to the bathroom and urinated blood.  She denies any fevers or chills.  She denies any bowel complaints.  The history is provided by the patient.  Flank Pain  This is a new problem. The current episode started 3 to 5 hours ago. The problem occurs constantly. The problem has not changed since onset.Nothing aggravates the symptoms. Nothing relieves the symptoms. She has tried nothing for the symptoms.    Past Medical History:  Diagnosis Date  . ADHD (attention deficit hyperactivity disorder)     Patient Active Problem List   Diagnosis Date Noted  . Cervicogenic headache 12/31/2013    Past Surgical History:  Procedure Laterality Date  . BREAST SURGERY       OB History   None      Home Medications    Prior to Admission medications   Medication Sig Start Date End Date Taking? Authorizing Provider  amphetamine-dextroamphetamine (ADDERALL XR) 30 MG 24 hr capsule Take 30 mg by mouth every morning.    [provider]  ibuprofen (ADVIL,MOTRIN) 800 MG tablet Take 1 tablet (800 mg total) by mouth 3 (three) times daily. 12/11/13   Piepenbrink, Victorino DikeJennifer, PA-C  tizanidine (ZANAFLEX) 2 MG capsule Take 1-2caps every 8 hours as needed. 12/31/13   Drema DallasJaffe, Adam R, DO    Family History Family History  Problem Relation Age of Onset  . Colon cancer Father     Social History Social History   Tobacco Use  . Smoking status: Current Every Day Smoker    Packs/day: 0.50    Types: Cigarettes  . Smokeless  tobacco: Never Used  Substance Use Topics  . Alcohol use: Yes    Comment: wine 3 nights a week   . Drug use: No     Allergies   Patient has no known allergies.   Review of Systems Review of Systems  Genitourinary: Positive for flank pain.  All other systems reviewed and are negative.    Physical Exam Updated Vital Signs BP (!) 131/93 (BP Location: Right Arm)   Pulse (!) 103   Temp 98.3 F (36.8 C) (Oral)   Resp 18   Ht 5\' 5"  (1.651 m)   Wt 70.3 kg (155 lb)   LMP 09/02/2017   SpO2 98%   BMI 25.79 kg/m   Physical Exam  Constitutional: She is oriented to person, place, and time. She appears well-developed and well-nourished. No distress.  HENT:  Head: Normocephalic and atraumatic.  Neck: Normal range of motion. Neck supple.  Cardiovascular: Normal rate and regular rhythm. Exam reveals no gallop and no friction rub.  No murmur heard. Pulmonary/Chest: Effort normal and breath sounds normal. No respiratory distress. She has no wheezes.  Abdominal: Soft. Bowel sounds are normal. She exhibits no distension. There is tenderness.  There is some mild right-sided CVA tenderness.  Musculoskeletal: Normal range of motion.  Neurological: She is alert and oriented to person, place, and time.  Skin: Skin is warm and dry. She  is not diaphoretic.  Nursing note and vitals reviewed.    ED Treatments / Results  Labs (all labs ordered are listed, but only abnormal results are displayed) Labs Reviewed  URINALYSIS, ROUTINE W REFLEX MICROSCOPIC  PREGNANCY, URINE  BASIC METABOLIC PANEL  CBC WITH DIFFERENTIAL/PLATELET    EKG None  Radiology No results found.  Procedures Procedures (including critical care time)  Medications Ordered in ED Medications  ondansetron (ZOFRAN) injection 4 mg (has no administration in time range)  ketorolac (TORADOL) 30 MG/ML injection 30 mg (has no administration in time range)  morphine 4 MG/ML injection 4 mg (has no administration in time  range)     Initial Impression / Assessment and Plan / ED Course  I have reviewed the triage vital signs and the nursing notes.  Pertinent labs & imaging results that were available during my care of the patient were reviewed by me and considered in my medical decision making (see chart for details).  Patient presenting with right flank pain and hematuria.  CT scan shows a proximal ureteral stone.  She is feeling better after receiving pain medicine.  She will be discharged with Percocet and follow-up with urology if symptoms are not improving in the next 2-3 days.  Final Clinical Impressions(s) / ED Diagnoses   Final diagnoses:  None    ED Discharge Orders    None       Geoffery Lyons, MD 09/09/17 [phone]

## 2017-09-09 NOTE — ED Triage Notes (Signed)
Pt c/o 8/10 right flank pain since last night getting worse today with some blood on the urine.

## 2017-09-09 NOTE — ED Notes (Signed)
Patient transported to CT 

## 2019-08-10 IMAGING — CT CT RENAL STONE PROTOCOL
2 of 4 series · 15 of 46 positions shown, 17 images · non-contrast
Comparison: None.

ADDENDUM:
Add to
CLINICAL DATA: Right flank pain and hematuria

EXAM:
CT ABDOMEN AND PELVIS WITHOUT CONTRAST
TECHNIQUE: Multidetector CT imaging of the abdomen and pelvis was performed
following the standard protocol without oral or IV contrast.

[Series 3: axial st · axial · 0.78mm/px · z∈[-504,-64]mm · 12 of 98 slices shown, 14 images]
[im 5/98  soft-tissue]
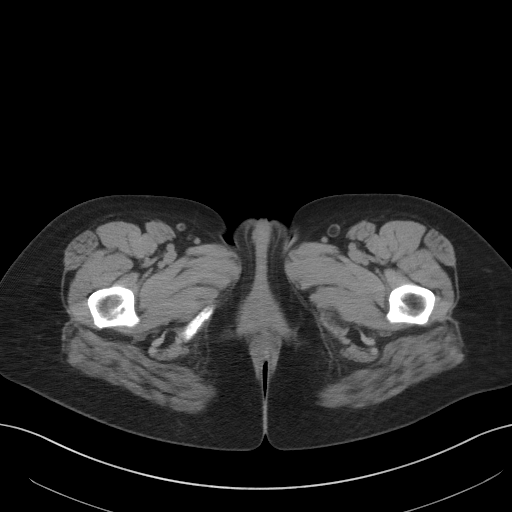
[im 5/98  bone]
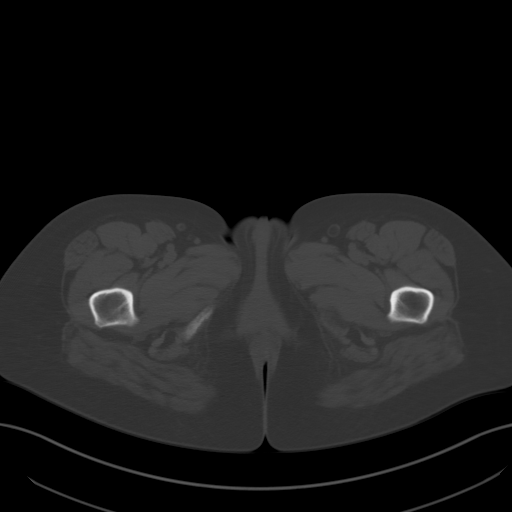
[im 13/98  soft-tissue]
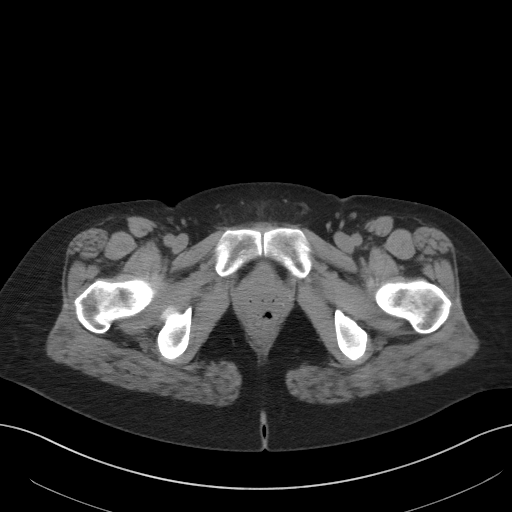
[im 22/98  soft-tissue]
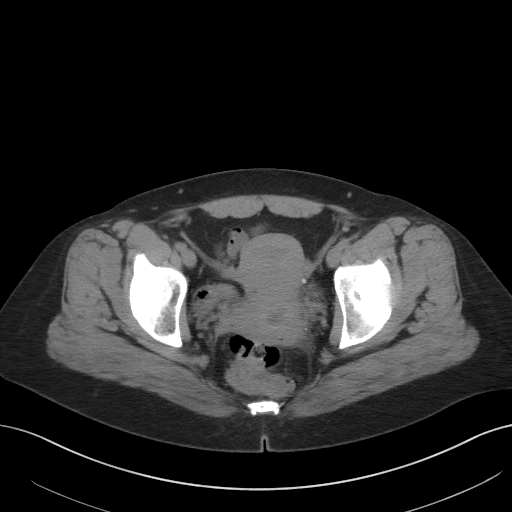
[im 30/98  soft-tissue]
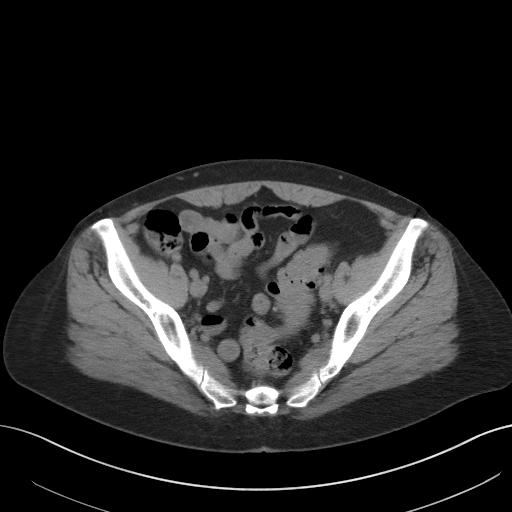
[im 38/98  soft-tissue]
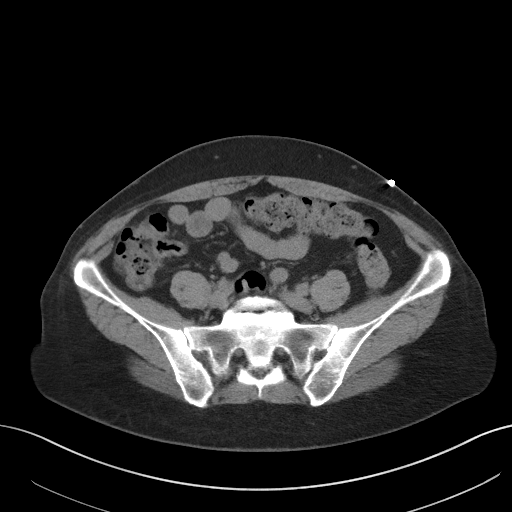
[im 47/98  soft-tissue]
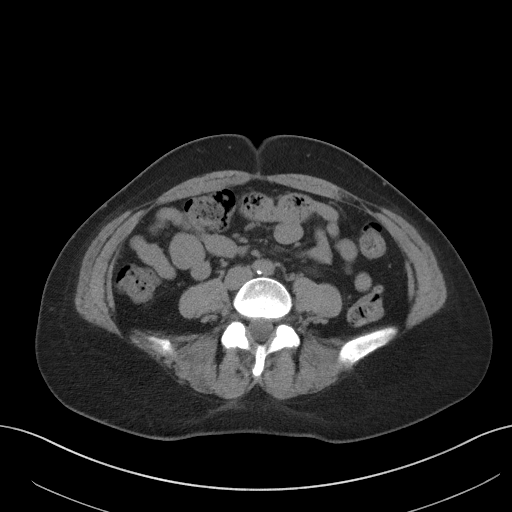
[im 51/98  soft-tissue]
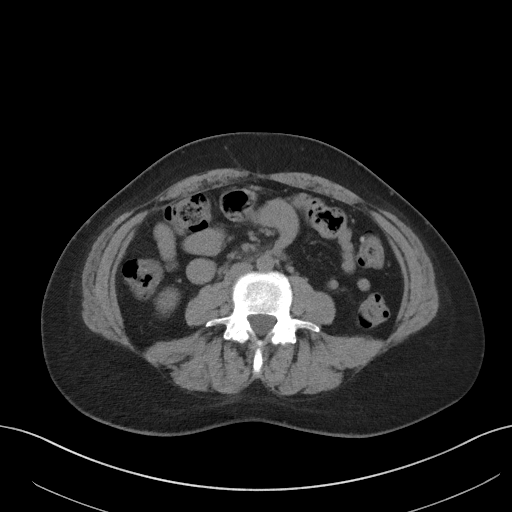
[im 60/98  soft-tissue]
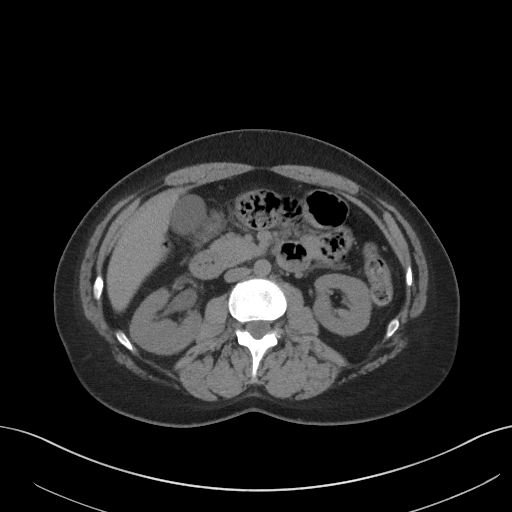
[im 68/98  soft-tissue]
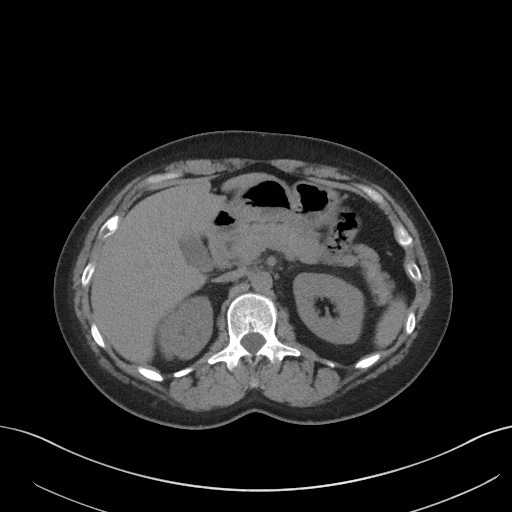
[im 68/98  bone]
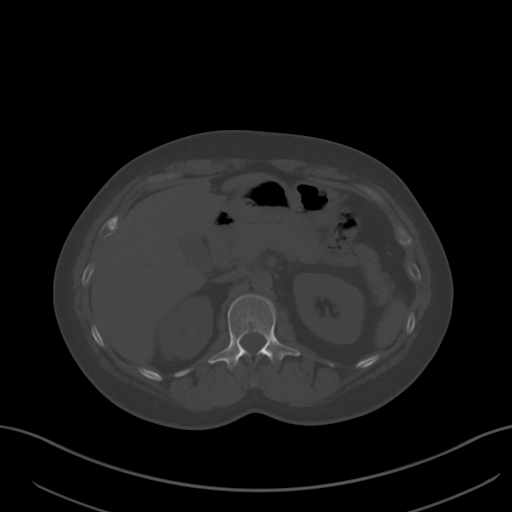
[im 76/98  soft-tissue]
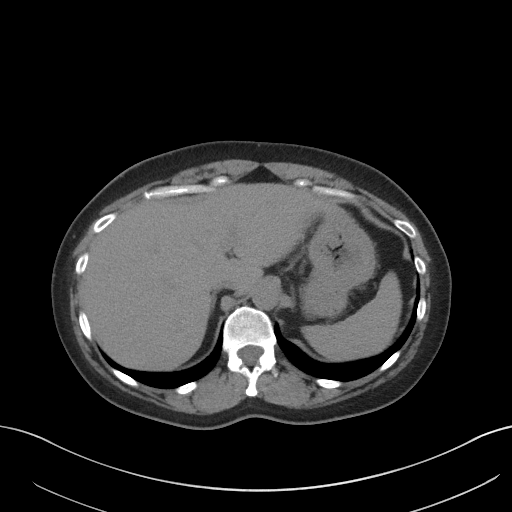
[im 85/98  soft-tissue]
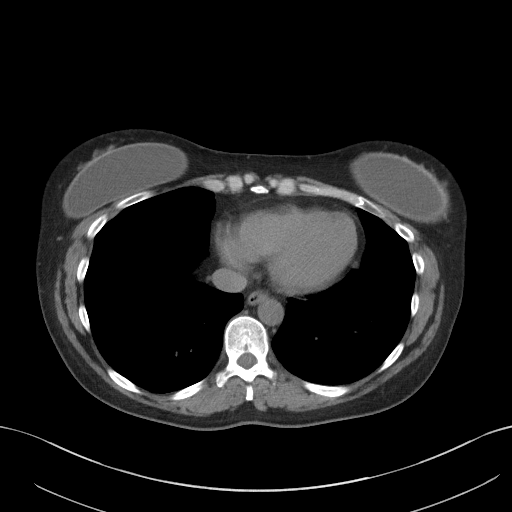
[im 93/98  soft-tissue]
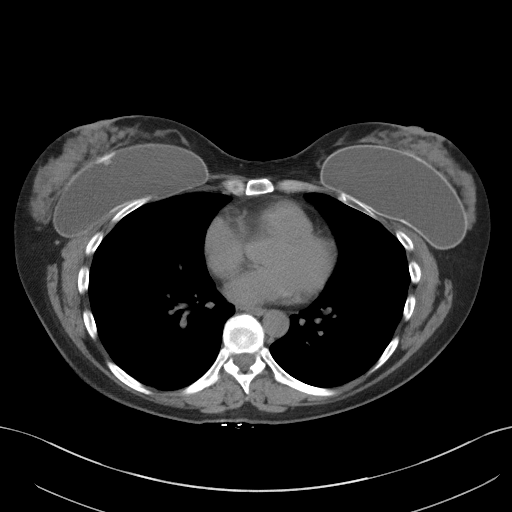

[Series 5: coronal st · coronal · 0.74mm/px · 3 of 73 slices shown]
[im 25/73  soft-tissue]
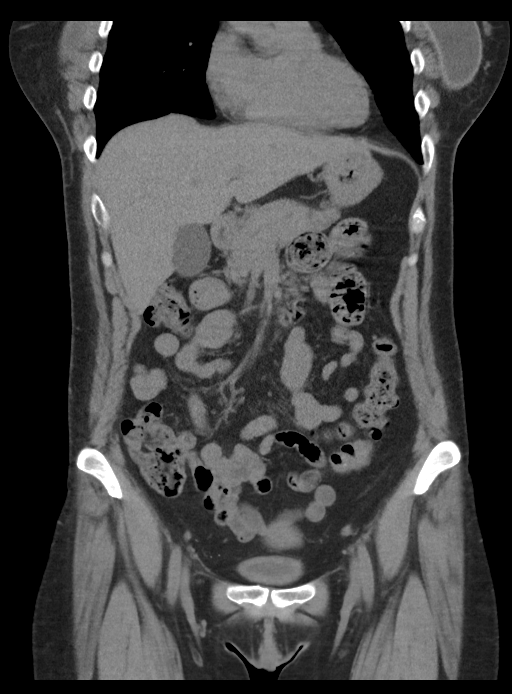
[im 33/73  soft-tissue]
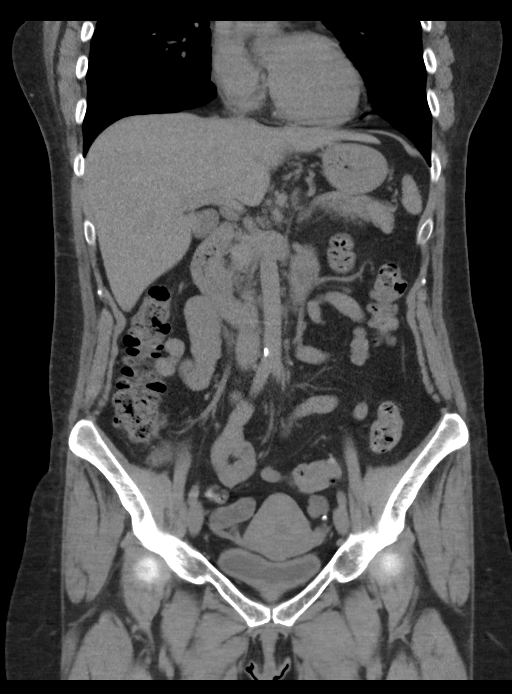
[im 41/73  soft-tissue]
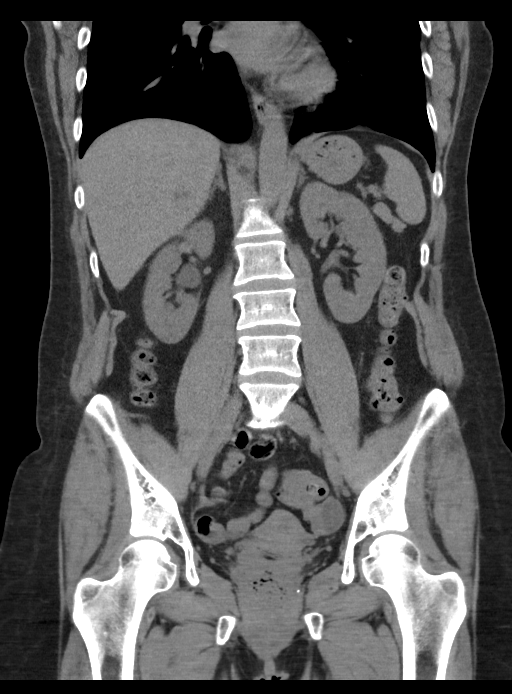

[15 of 46 positions shown; findings below may reference images not displayed]

IMPRESSION: Apparent leiomyoma along the superior uterine fundus.
FINDINGS: Lower chest: Lung bases are clear.

Hepatobiliary: There is a 1.4 x 1.4 cm cyst in the medial aspect of
the anterior segment of the right lobe of the liver. No other focal
liver lesions are identified on this noncontrast enhanced study.
Gallbladder wall is not appreciably thickened. There is no biliary
duct dilatation.

Pancreas: No pancreatic mass or inflammatory focus.

Spleen: No splenic lesions are evident.

Adrenals/Urinary Tract: Adrenals bilaterally appear normal. There is
no renal mass on either side. There is moderate hydronephrosis on
the right. There is no appreciable hydronephrosis on the left. There
is a 2 mm calculus in the upper pole right kidney. There is a 1 mm
calculus in the lower pole left kidney. There is a 3 mm calculus in
the proximal right ureter at the level of L3-4. No other ureteral
calculi are identified on either side. Urinary bladder is midline
with wall thickness within normal limits.

Stomach/Bowel: There are multiple sigmoid diverticula without
diverticulitis. There is no appreciable bowel wall or mesenteric
thickening. No evident bowel obstruction. No free air or portal
venous air.

Vascular/Lymphatic: No abdominal aortic aneurysm. There is
calcification in the distal aorta. Major mesenteric vessels appear
patent on this noncontrast enhanced study. There is no appreciable
adenopathy in the abdomen or pelvis.

Reproductive: Uterus is anteverted. There is prominence along the
superior aspect of the uterine fundus measuring approximately 2.6 x
1.6 cm, best appreciated on coronal and sagittal images, likely due
to a leiomyoma. There is a dominant follicle in the left ovary
measuring 1.8 x 1.6 cm. No other pelvic mass evident.

Other: Appendix appears normal. No abscess or ascites evident in the
abdomen or pelvis. There is a rather minimal ventral hernia
containing only fat.

Musculoskeletal: There is degenerative change at L5-S1 with vacuum
phenomenon. There are no blastic or lytic bone lesions. No
intramuscular or abdominal wall lesion evident. There are breast
implants bilaterally.
IMPRESSION: 1. 3 mm calculus in proximal right ureter at the L3-4 level causing
moderate hydronephrosis and proximal ureterectasis on the right.

2.  Small nonobstructing calculi in each kidney.

3. Sigmoid diverticulosis without diverticulitis. No bowel
obstruction. No abscess. Appendix appears normal.

4.  Aortic atherosclerosis.

5.  Rather minimal ventral hernia containing only fat.

Aortic Atherosclerosis ([id number]).
